# Patient Record
Sex: Female | Born: 1990 | Race: White | Hispanic: No | Marital: Married | Smoking: Never smoker
Health system: Southern US, Community
[De-identification: ages and names within clinical notes are randomized; demographics above are authoritative.]

---
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
## 2015-02-19 ENCOUNTER — Telehealth: Payer: Self-pay | Admitting: Family Medicine

## 2015-02-19 NOTE — Telephone Encounter (Signed)
I called patient and she scheduled appointment on 02/28/15 at 11:00.

## 2015-02-19 NOTE — Telephone Encounter (Signed)
Yes ok to schedule new patient appt.

## 2015-02-19 NOTE — Telephone Encounter (Signed)
Patient called to schedule a new patient appointment with Dr.Aron.  Patient said her mother,Linda Allison,spoke to Dr.Aron and was told Dr.Aron isn't seeing new patient, but would see family members of her patients.  Can I make a new patient appointment for patient with you?

## 2015-02-28 ENCOUNTER — Encounter: Payer: Self-pay | Admitting: Family Medicine

## 2015-02-28 ENCOUNTER — Ambulatory Visit (INDEPENDENT_AMBULATORY_CARE_PROVIDER_SITE_OTHER): Payer: BLUE CROSS/BLUE SHIELD | Admitting: Family Medicine

## 2015-02-28 VITALS — BP 100/60 | HR 84 | Temp 98.1°F | Ht 66.25 in | Wt 170.5 lb

## 2015-02-28 DIAGNOSIS — R635 Abnormal weight gain: Secondary | ICD-10-CM

## 2015-02-28 LAB — COMPREHENSIVE METABOLIC PANEL
ALT: 15 U/L (ref 0–35)
AST: 17 U/L (ref 0–37)
Albumin: 4.3 g/dL (ref 3.5–5.2)
Alkaline Phosphatase: 46 U/L (ref 39–117)
BUN: 12 mg/dL (ref 6–23)
CHLORIDE: 103 meq/L (ref 96–112)
CO2: 29 meq/L (ref 19–32)
CREATININE: 0.67 mg/dL (ref 0.40–1.20)
Calcium: 9.3 mg/dL (ref 8.4–10.5)
GFR: 115.33 mL/min (ref >60.00)
GLUCOSE: 96 mg/dL (ref 70–99)
Potassium: 4.2 mEq/L (ref 3.5–5.1)
SODIUM: 136 meq/L (ref 135–145)
Total Bilirubin: 1 mg/dL (ref 0.2–1.2)
Total Protein: 6.8 g/dL (ref 6.0–8.3)

## 2015-02-28 LAB — CBC WITH DIFFERENTIAL/PLATELET
BASOS PCT: 0.5 % (ref 0.0–3.0)
Basophils Absolute: 0 10*3/uL (ref 0.0–0.1)
EOS ABS: 0.4 10*3/uL (ref 0.0–0.7)
Eosinophils Relative: 5.4 % — ABNORMAL HIGH (ref 0.0–5.0)
HCT: 41.5 % (ref 36.0–46.0)
Hemoglobin: 14.1 g/dL (ref 12.0–15.0)
Lymphocytes Relative: 24 % (ref 12.0–46.0)
Lymphs Abs: 1.8 10*3/uL (ref 0.7–4.0)
MCHC: 34 g/dL (ref 30.0–36.0)
MCV: 86.1 fl (ref 78.0–100.0)
MONO ABS: 0.4 10*3/uL (ref 0.1–1.0)
Monocytes Relative: 5.4 % (ref 3.0–12.0)
NEUTROS ABS: 5 10*3/uL (ref 1.4–7.7)
NEUTROS PCT: 64.7 % (ref 43.0–77.0)
PLATELETS: 213 10*3/uL (ref 150.0–400.0)
RBC: 4.82 Mil/uL (ref 3.87–5.11)
RDW: 12.3 % (ref 11.5–15.5)
WBC: 7.7 10*3/uL (ref 4.0–10.5)

## 2015-02-28 LAB — LIPID PANEL
CHOL/HDL RATIO: 4
Cholesterol: 185 mg/dL (ref 0–200)
HDL: 52.8 mg/dL (ref >39.00)
LDL CALC: 116 mg/dL — AB (ref 0–99)
NONHDL: 132.11
TRIGLYCERIDES: 79 mg/dL (ref 0.0–149.0)
VLDL: 15.8 mg/dL (ref 0.0–40.0)

## 2015-02-28 LAB — TSH: TSH: 1.61 u[IU]/mL (ref 0.35–4.50)

## 2015-02-28 NOTE — Patient Instructions (Signed)
It was great to meet you. We will call you with your lab results and you can view them online. 

## 2015-02-28 NOTE — Progress Notes (Signed)
Pre visit review using our clinic review tool, if applicable. No additional management support is needed unless otherwise documented below in the visit note. 

## 2015-02-28 NOTE — Assessment & Plan Note (Signed)
>  25 minutes spent in face to face time with patient, >50% spent in counselling or coordination of care Check labs today to rule out reversible causes. We also discussed seeing a nutritionist and or short course of phentermine (discussed risks of this today). Await lab results before making our weight loss plan. The patient indicates understanding of these issues and agrees with the plan.

## 2015-02-28 NOTE — Progress Notes (Signed)
   Subjective:   Patient ID: Madison Dunn, female    DOB: [birth year], 25 y.o.   MRN: [id number]  Madison Dunn is a pleasant 25 y.o. year old female who presents to clinic today with Establish Care  on 02/28/2015  HPI:  Weight gain- past couple of months, has un intentionally gained 20 pounds. Has not changed diet at all. She exercises two to three times a week- this has not changed.  Unsure if she has a family history of thyroid dysfunction.  Denies any symptoms of hypothyroidism.    No current outpatient prescriptions on file prior to visit.   No current facility-administered medications on file prior to visit.    No Known Allergies  No past medical history on file.  No past surgical history on file.  No family history on file.  Social History   Social History  . Marital Status: Married    Spouse Name: N/A  . Number of Children: N/A  . Years of Education: N/A   Occupational History  . Not on file.   Social History Main Topics  . Smoking status: Never Smoker   . Smokeless tobacco: Never Used  . Alcohol Use: 0.0 oz/week    0 Standard drinks or equivalent per week     Comment: occassionallly  . Drug Use: Not on file  . Sexual Activity: Not on file   Other Topics Concern  . 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  Review of Systems  Constitutional: Positive for unexpected weight change.  HENT: Negative.   Eyes: Negative.   Respiratory: Negative.   Cardiovascular: Negative.   Gastrointestinal: Negative.   Endocrine: Negative.   Genitourinary: Negative.   Musculoskeletal: Negative.   Allergic/Immunologic: Negative.   Neurological: Negative.   Hematological: Negative.   Psychiatric/Behavioral: Negative.        Objective:    BP 100/60 mmHg  Pulse 84   Temp(Src) 98.1 F (36.7 C) (Oral)  Ht 5' 6.25" (1.683 m)  Wt 170 lb 8 oz (77.338 kg)  BMI 27.30 kg/m2  LMP 02/06/2015   Physical Exam  Constitutional: She is oriented to person, place, and time. She appears well-developed and well-nourished. No distress.  HENT:  Head: Normocephalic.  Eyes: Conjunctivae are normal.  Neck: Normal range of motion.  Cardiovascular: Normal rate and regular rhythm.   Pulmonary/Chest: Effort normal and breath sounds normal.  Abdominal: Soft.  Musculoskeletal: Normal range of motion. She exhibits no edema.  Neurological: She is alert and oriented to person, place, and time. No cranial nerve deficit.  Skin: Skin is warm and dry. She is not diaphoretic.  Psychiatric: She has a normal mood and affect. Her behavior is normal. Thought content normal.  Nursing note and vitals reviewed.         Assessment & Plan:   Weight gain - Plan: CBC with Differential/Platelet, Comprehensive metabolic panel, Lipid panel, TSH No Follow-up on file.

## 2015-03-01 ENCOUNTER — Encounter: Payer: Self-pay | Admitting: *Deleted

## 2015-03-23 ENCOUNTER — Other Ambulatory Visit: Payer: Self-pay | Admitting: Obstetrics & Gynecology

## 2015-03-23 DIAGNOSIS — N63 Unspecified lump in unspecified breast: Secondary | ICD-10-CM

## 2015-04-04 ENCOUNTER — Ambulatory Visit
Admission: RE | Admit: 2015-04-04 | Discharge: 2015-04-04 | Disposition: A | Discharge status: Home / Self Care | Payer: BLUE CROSS/BLUE SHIELD | Source: Ambulatory Visit | Attending: Obstetrics & Gynecology | Admitting: Obstetrics & Gynecology

## 2015-04-04 DIAGNOSIS — R928 Other abnormal and inconclusive findings on diagnostic imaging of breast: Secondary | ICD-10-CM | POA: Insufficient documentation

## 2015-04-04 DIAGNOSIS — N63 Unspecified lump in unspecified breast: Secondary | ICD-10-CM

## 2016-07-10 IMAGING — US US BREAST LTD UNI RIGHT INC AXILLA
1 series · 3 of 3 positions shown · non-contrast
Comparison: No prior exams.

CLINICAL DATA: Ordering physician describes a palpable mass within
the right breast at the 4 o'clock axis region.

EXAM:
ULTRASOUND OF THE RIGHT BREAST

[Series 1: us breast ltd uni right inc axilla · 0.07mm/px · 3 of 3 slices shown]
[im 1/3]
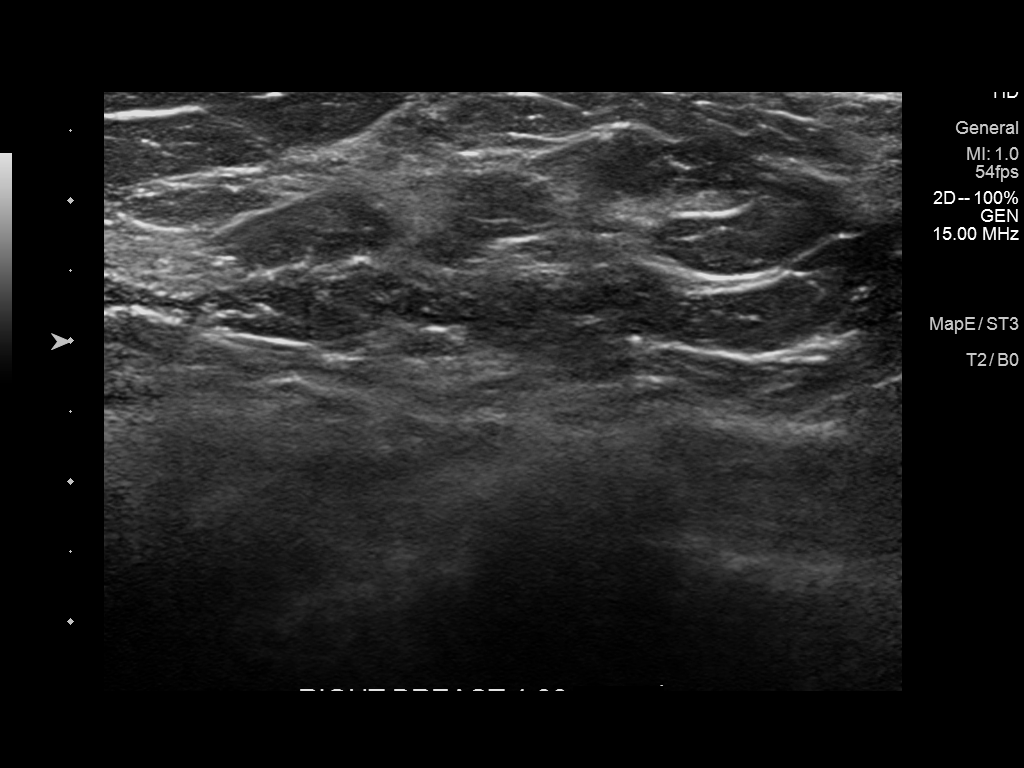
[im 2/3]
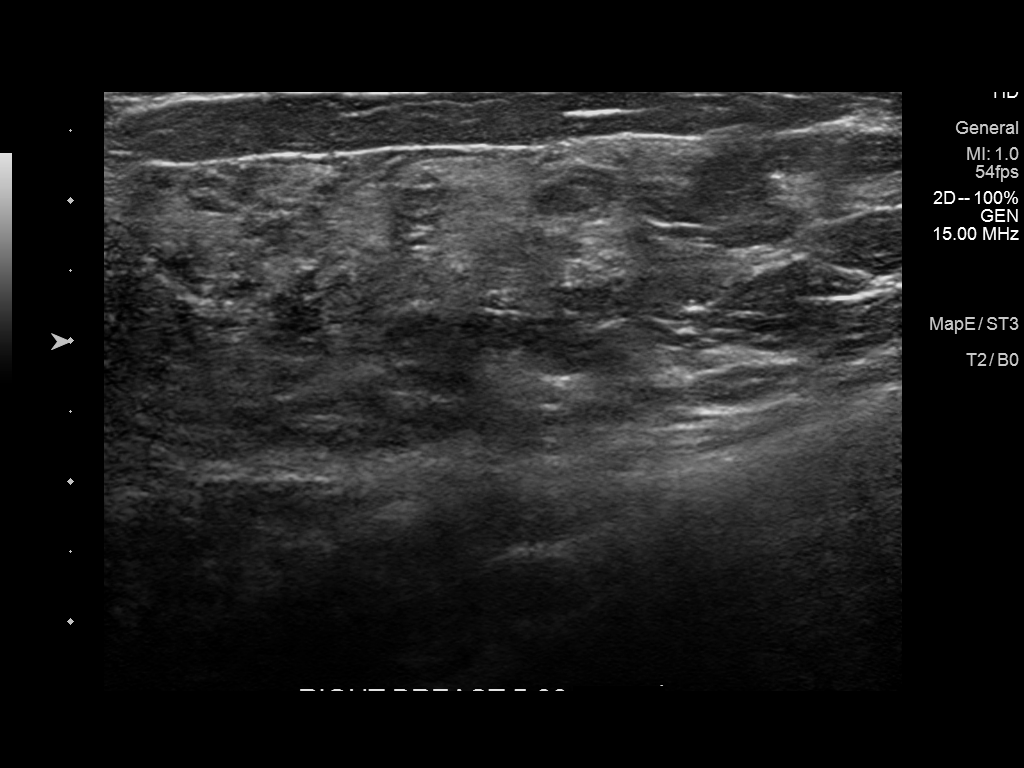
[im 3/3]
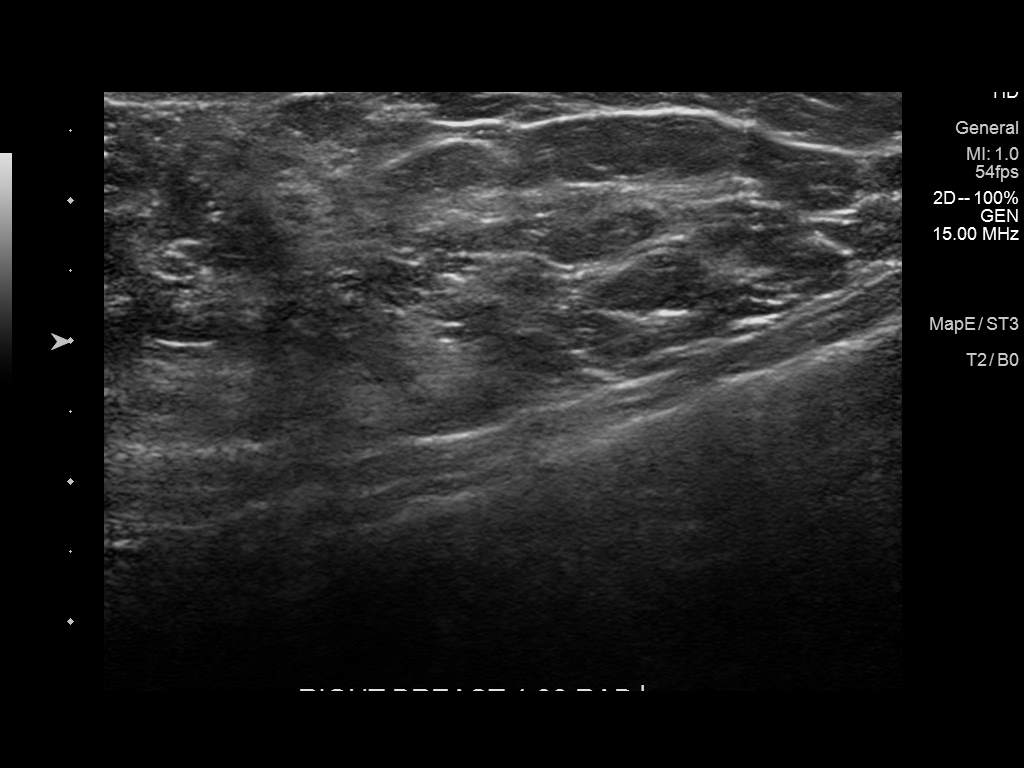

[3 of 3 positions shown; findings below may reference images not displayed]

FINDINGS: On physical exam, there is vague soft tissue thickening within the
lower inner quadrant of the right breast.

Targeted ultrasound is performed, evaluating the entire lower inner
quadrant of the right breast with particular attention to the 4
o'clock axis, showing only normal fibroglandular tissues and fat
lobules throughout. No suspicious solid or cystic masses are
identified.
IMPRESSION: No sonographic evidence of malignancy within the lower inner
quadrant of the right breast. There are normal dense fibroglandular
tissues corresponding to the area of clinical concern.

RECOMMENDATION:
Screening mammogram at age 41 unless there are persistent or
intervening clinical concerns. (Code:[id number]-L-8Z6)

The patient was instructed to return sooner if this area of clinical
concern becomes larger and/or firmer to palpation or if a new
palpable abnormality was identified.

I have discussed the findings and recommendations with the patient.
Results were also provided in writing at the conclusion of the
visit. If applicable, a reminder letter will be sent to the patient
regarding the next appointment.

BI-RADS CATEGORY  1: Negative

## 2019-07-06 ENCOUNTER — Telehealth: Payer: Self-pay | Admitting: General Practice

## 2019-07-06 NOTE — Telephone Encounter (Signed)
Removed Dr. Aron as patient's PCP due to her leaving this practice and patient has not been seen by her in 4 years.
